# Patient Record
Sex: Male | Born: 1995 | Race: Black or African American | Hispanic: No | Marital: Single | Smoking: Former smoker
Health system: Southern US, Community
[De-identification: ages and names within clinical notes are randomized; demographics above are authoritative.]

## PROBLEM LIST (undated history)

## (undated) DIAGNOSIS — S62339A Displaced fracture of neck of unspecified metacarpal bone, initial encounter for closed fracture: Secondary | ICD-10-CM

---

## 2016-12-30 ENCOUNTER — Emergency Department (HOSPITAL_BASED_OUTPATIENT_CLINIC_OR_DEPARTMENT_OTHER): Payer: No Typology Code available for payment source

## 2016-12-30 ENCOUNTER — Encounter (HOSPITAL_BASED_OUTPATIENT_CLINIC_OR_DEPARTMENT_OTHER): Payer: Self-pay | Admitting: *Deleted

## 2016-12-30 ENCOUNTER — Emergency Department (HOSPITAL_BASED_OUTPATIENT_CLINIC_OR_DEPARTMENT_OTHER)
Admission: EM | Admit: 2016-12-30 | Discharge: 2016-12-31 | Disposition: A | Discharge status: Home / Self Care | Payer: No Typology Code available for payment source | Attending: Emergency Medicine | Admitting: Emergency Medicine

## 2016-12-30 DIAGNOSIS — M545 Low back pain: Secondary | ICD-10-CM | POA: Insufficient documentation

## 2016-12-30 DIAGNOSIS — Y999 Unspecified external cause status: Secondary | ICD-10-CM | POA: Diagnosis not present

## 2016-12-30 DIAGNOSIS — R51 Headache: Secondary | ICD-10-CM | POA: Insufficient documentation

## 2016-12-30 DIAGNOSIS — M25511 Pain in right shoulder: Secondary | ICD-10-CM | POA: Diagnosis not present

## 2016-12-30 DIAGNOSIS — M542 Cervicalgia: Secondary | ICD-10-CM | POA: Insufficient documentation

## 2016-12-30 DIAGNOSIS — Y939 Activity, unspecified: Secondary | ICD-10-CM | POA: Diagnosis not present

## 2016-12-30 DIAGNOSIS — Y92410 Unspecified street and highway as the place of occurrence of the external cause: Secondary | ICD-10-CM | POA: Diagnosis not present

## 2016-12-30 DIAGNOSIS — S199XXA Unspecified injury of neck, initial encounter: Secondary | ICD-10-CM | POA: Diagnosis present

## 2016-12-30 MED ORDER — NAPROXEN 250 MG PO TABS
500.0000 mg | ORAL_TABLET | Freq: Once | ORAL | Status: AC
  Administered 2016-12-30: 500 mg via ORAL
  Filled 2016-12-30: qty 2

## 2016-12-30 MED ORDER — CYCLOBENZAPRINE HCL 5 MG PO TABS
5.0000 mg | ORAL_TABLET | Freq: Once | ORAL | Status: AC
  Administered 2016-12-30: 5 mg via ORAL
  Filled 2016-12-30: qty 1
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
## 2016-12-30 NOTE — ED Notes (Signed)
Patient transported to X-ray 
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
## 2016-12-31 MED ORDER — CYCLOBENZAPRINE HCL 5 MG PO TABS: 5.0000 mg | ORAL_TABLET | Freq: Three times a day (TID) | ORAL | 0 refills | Status: DC | PRN

## 2016-12-31 MED ORDER — NAPROXEN 500 MG PO TABS: 500.0000 mg | ORAL_TABLET | Freq: Two times a day (BID) | ORAL | 0 refills | Status: DC

## 2016-12-31 NOTE — Discharge Instructions (Signed)
Read the information below.  Your x-rays and imaging were re-assuring.  You may feel sore for the next 2-3 days. I have prescribed naprosyn and flexeril for relief. While taking naprosyn do not take other NSAIDs (ibuprofen, motrin, or aleve). Flexeril can make you drowsy, do not drive after taking.  You can apply heat/ice to affected areas for 20 minute increments.  Warm showers can soothe sore muscles.  If symptoms persist for more than a week follow up with your primary provider.  Use the prescribed medication as directed.  Please discuss all new medications with your pharmacist.   You may return to the Emergency Department at any time for worsening condition or any new symptoms that concern you.

## 2020-02-23 ENCOUNTER — Other Ambulatory Visit: Payer: Self-pay

## 2020-02-23 ENCOUNTER — Emergency Department (HOSPITAL_BASED_OUTPATIENT_CLINIC_OR_DEPARTMENT_OTHER): Payer: Self-pay

## 2020-02-23 ENCOUNTER — Encounter (HOSPITAL_BASED_OUTPATIENT_CLINIC_OR_DEPARTMENT_OTHER): Payer: Self-pay

## 2020-02-23 ENCOUNTER — Emergency Department (HOSPITAL_BASED_OUTPATIENT_CLINIC_OR_DEPARTMENT_OTHER)
Admission: EM | Admit: 2020-02-23 | Discharge: 2020-02-24 | Disposition: A | Discharge status: Home / Self Care | Payer: Self-pay | Attending: Emergency Medicine | Admitting: Emergency Medicine

## 2020-02-23 DIAGNOSIS — S62326A Displaced fracture of shaft of fifth metacarpal bone, right hand, initial encounter for closed fracture: Secondary | ICD-10-CM | POA: Insufficient documentation

## 2020-02-23 DIAGNOSIS — W228XXA Striking against or struck by other objects, initial encounter: Secondary | ICD-10-CM | POA: Insufficient documentation

## 2020-02-23 DIAGNOSIS — Y9389 Activity, other specified: Secondary | ICD-10-CM | POA: Insufficient documentation

## 2020-02-23 DIAGNOSIS — Z880 Allergy status to penicillin: Secondary | ICD-10-CM | POA: Insufficient documentation

## 2020-02-23 DIAGNOSIS — S62339A Displaced fracture of neck of unspecified metacarpal bone, initial encounter for closed fracture: Secondary | ICD-10-CM

## 2020-02-23 DIAGNOSIS — Y929 Unspecified place or not applicable: Secondary | ICD-10-CM | POA: Insufficient documentation

## 2020-02-23 DIAGNOSIS — Y999 Unspecified external cause status: Secondary | ICD-10-CM | POA: Insufficient documentation

## 2020-02-23 NOTE — ED Triage Notes (Signed)
Pt states he punched a wall 2 days ago-swelling noted to right hand-NAD-steady gait

## 2020-02-24 ENCOUNTER — Encounter (HOSPITAL_BASED_OUTPATIENT_CLINIC_OR_DEPARTMENT_OTHER): Payer: Self-pay | Admitting: Orthopedic Surgery

## 2020-02-24 ENCOUNTER — Other Ambulatory Visit: Payer: Self-pay | Admitting: Orthopedic Surgery

## 2020-02-24 ENCOUNTER — Emergency Department (HOSPITAL_BASED_OUTPATIENT_CLINIC_OR_DEPARTMENT_OTHER): Payer: Self-pay

## 2020-02-24 ENCOUNTER — Inpatient Hospital Stay (HOSPITAL_COMMUNITY): Admission: RE | Admit: 2020-02-24 | Payer: Self-pay | Source: Ambulatory Visit

## 2020-02-24 MED ORDER — ACETAMINOPHEN 500 MG PO TABS
1000.0000 mg | ORAL_TABLET | Freq: Once | ORAL | Status: AC
  Administered 2020-02-24: 02:00:00 1000 mg via ORAL
  Filled 2020-02-24: qty 2

## 2020-02-24 MED ORDER — HYDROCODONE-ACETAMINOPHEN 5-325 MG PO TABS
1.0000 | ORAL_TABLET | Freq: Once | ORAL | Status: AC
  Administered 2020-02-24: 01:00:00 1 via ORAL
  Filled 2020-02-24: qty 1

## 2020-02-24 MED ORDER — LIDOCAINE HCL 2 % IJ SOLN
10.0000 mL | Freq: Once | INTRAMUSCULAR | Status: AC
  Administered 2020-02-24: 200 mg via INTRADERMAL
  Filled 2020-02-24: qty 20

## 2020-02-24 MED ORDER — HYDROCODONE-ACETAMINOPHEN 5-325 MG PO TABS: 1.0000 | ORAL_TABLET | Freq: Three times a day (TID) | ORAL | 0 refills | Status: AC | PRN

## 2020-02-24 MED ORDER — ACETAMINOPHEN 500 MG PO TABS: 1000.0000 mg | ORAL_TABLET | Freq: Three times a day (TID) | ORAL | 0 refills | Status: AC

## 2020-02-24 NOTE — ED Provider Notes (Signed)
[facility] EMERGENCY DEPARTMENT Provider Note  CSN: [id number] Arrival date & time: 02/23/20 2130  Chief Complaint(s) Hand Injury  HPI Cameron Franco is a 24 y.o. male with no pertinent past medical history presents to the emergency department with right hand pain and swelling that began 2 days ago after punching a wall.  Pain is throbbing.  Worse with palpation.  Patient is unable to range his small finger due to the pain.  Denies any numbness or tingling.  Denies any other injuries.  No other physical complaints.  HPI  Past Medical History History reviewed. No pertinent past medical history. There are no problems to display for this patient.  Home Medication(s) Prior to Admission medications   Medication Sig Start Date End Date Taking? Authorizing Provider  acetaminophen (TYLENOL) 500 MG tablet Take 2 tablets (1,000 mg total) by mouth every 8 (eight) hours for 5 days. Do not take more than 4000 mg of acetaminophen (Tylenol) in a 24-hour period. Please note that other medicines that you may be prescribed may have Tylenol as well. 02/24/20 02/29/20  Fatima Blank, MD  cyclobenzaprine (FLEXERIL) 5 MG tablet Take 1 tablet (5 mg total) by mouth 3 (three) times daily as needed for muscle spasms. 12/31/16   Frederica Kuster, PA-C  HYDROcodone-acetaminophen (NORCO/VICODIN) 5-325 MG tablet Take 1 tablet by mouth every 8 (eight) hours as needed for up to 3 days for severe pain (That is not improved by your scheduled acetaminophen regimen). Please do not exceed 4000 mg of acetaminophen (Tylenol) a 24-hour period. Please note that he may be prescribed additional medicine that contains acetaminophen. 02/24/20 02/27/20  Fatima Blank, MD  naproxen (NAPROSYN) 500 MG tablet Take 1 tablet (500 mg total) by mouth 2 (two) times daily. 12/31/16   Caryl Ada        Past Surgical History History reviewed. No pertinent surgical history. Family History No family history on file.  Social History Social History   Tobacco Use  . Smoking status: Former Research scientist (life sciences)  . Smokeless tobacco: Never Used  Substance Use Topics  . Alcohol use: Yes    Comment: occ  . Drug use: No   Allergies Penicillins  Review of Systems Review of Systems All other systems are reviewed and are negative for acute change except as noted in the HPI  Physical Exam Vital Signs  I have reviewed the triage vital signs BP 129/83   Pulse 73   Temp 98.7 F (37.1 C) (Oral)   Resp 18   Ht 5\' 10"  (1.778 m)   Wt 70.3 kg   SpO2 100%   BMI 22.24 kg/m   Physical Exam Vitals reviewed.  Constitutional:      General: He is not in acute distress.    Appearance: He is well-developed. He is not diaphoretic.  HENT:     Head: Normocephalic and atraumatic.     Jaw: No trismus.     Right Ear: External ear normal.     Left Ear: External ear normal.     Nose: Nose normal.  Eyes:     General: No scleral icterus.    Conjunctiva/sclera: Conjunctivae normal.  Neck:     Trachea: Phonation normal.  Cardiovascular:     Rate and Rhythm: Normal rate and regular rhythm.  Pulmonary:     Effort: Pulmonary effort is normal. No respiratory distress.     Breath sounds: No stridor.  Abdominal:     General: There is no distension.  Musculoskeletal:  Right hand: Swelling, deformity, tenderness and bony tenderness present. No lacerations. Decreased range of motion. Normal sensation. Normal capillary refill. Normal pulse.     Cervical back: Normal range of motion.  Neurological:     Mental Status: He is alert and oriented to person, place, and time.  Psychiatric:        Behavior: Behavior normal.     ED Results and Treatments Labs (all labs ordered are listed, but only abnormal results are displayed) Labs Reviewed - No data to display                                                                                                                        EKG  EKG Interpretation  Date/Time:    Ventricular Rate:    PR Interval:    QRS Duration:   QT Interval:    QTC Calculation:   R Axis:     Text Interpretation:        Radiology DG Hand 2 View Right  Result Date: 02/24/2020 CLINICAL DATA:  Punched a wall 3 days ago, reduction of fifth metacarpal fracture EXAM: RIGHT HAND - 2 VIEW COMPARISON:  02/23/2020 FINDINGS: Frontal and lateral views of the right hand demonstrate cast material along the ulnar aspect of the hand. The comminuted fifth metacarpal fracture demonstrates persistent but decreased volar angulation, measuring approximately 33 degrees. Otherwise alignment is anatomic. No additional fractures. IMPRESSION: 1. Persistent but decreased volar angulation of the comminuted fifth metacarpal fracture. Electronically Signed   By: Sharlet Salina M.D.   On: 02/24/2020 01:37   DG Hand Complete Right  Result Date: 02/23/2020 CLINICAL DATA:  Punched a wall 2 days ago.  Pain and swelling. EXAM: RIGHT HAND - COMPLETE 3+ VIEW COMPARISON:  01/08/2016 FINDINGS: Comminuted fracture of the mid-diaphysis the fifth metacarpal with slight dorsal angulation. Fracture does not extend to the proximal or distal articular surface. Other bones are normal. IMPRESSION: Comminuted fracture of the midportion of the fifth metacarpal with slight dorsal angulation. Electronically Signed   By: Paulina Fusi M.D.   On: 02/23/2020 22:05    Pertinent labs & imaging results that were available during my care of the patient were reviewed by me and considered in my medical decision making (see chart for details).  Medications Ordered in ED Medications  lidocaine (XYLOCAINE) 2 % (with pres) injection 200 mg (200 mg Intradermal Given 02/24/20 0112)  HYDROcodone-acetaminophen (NORCO/VICODIN) 5-325 MG per tablet 1 tablet (1 tablet Oral Given 02/24/20 0036)  acetaminophen (TYLENOL) tablet 1,000 mg (1,000 mg  Oral Given 02/24/20 0137)  Procedures .Ortho Injury Treatment  Date/Time: 02/24/2020 2:59 AM Performed by: Nira Conn, MD Authorized by: Nira Conn, MD   Consent:    Consent obtained:  Verbal   Consent given by:  Patient   Risks discussed:  Irreducible dislocation, recurrent dislocation and restricted joint movement   Alternatives discussed:  Immobilization and referralInjury location: hand Location details: right hand Injury type: fracture-dislocation Pre-procedure neurovascular assessment: neurovascularly intact Pre-procedure range of motion: reduced Anesthesia: hematoma block  Anesthesia: Local anesthesia used: yes Local Anesthetic: lidocaine 2% without epinephrine Anesthetic total: 10 mL  Patient sedated: NoManipulation performed: yes Skeletal traction used: yes Reduction successful: yes X-ray confirmed reduction: yes Immobilization: splint Splint type: ulnar gutter Supplies used: Ortho-Glass Post-procedure neurovascular assessment: post-procedure neurovascularly intact Patient tolerance: patient tolerated the procedure well with no immediate complications  .Splint Application  Date/Time: 02/24/2020 3:00 AM Performed by: Nira Conn, MD Authorized by: Nira Conn, MD   Consent:    Consent obtained:  Verbal   Consent given by:  Patient   Risks discussed:  Numbness Pre-procedure details:    Sensation:  Normal Procedure details:    Laterality:  Right   Location:  Hand   Hand:  R hand   Splint type:  Ulnar gutter   Supplies:  Ortho-Glass Post-procedure details:    Sensation:  Normal   Patient tolerance of procedure:  Tolerated well, no immediate complications    (including critical care time)  Medical Decision Making / ED Course I have reviewed the nursing notes for this encounter and  the patient's prior records (if available in EHR or on provided paperwork).   Barnett Elzey was evaluated in Emergency Department on 02/24/2020 for the symptoms described in the history of present illness. He was evaluated in the context of the global COVID-19 pandemic, which necessitated consideration that the patient might be at risk for infection with the SARS-CoV-2 virus that causes COVID-19. Institutional protocols and algorithms that pertain to the evaluation of patients at risk for COVID-19 are in a state of rapid change based on information released by regulatory bodies including the CDC and federal and state organizations. These policies and algorithms were followed during the patient's care in the ED.  Plain film consistent with boxer's fracture with dorsal angulation. Fracture was reduced under hematoma block and ulnar splints placed. Improved angulation following reduction. We will have patient follow-up with hand surgery.      Final Clinical Impression(s) / ED Diagnoses Final diagnoses:  Closed boxer's fracture, initial encounter   The patient appears reasonably screened and/or stabilized for discharge and I doubt any other medical condition or other [facility] requiring further screening, evaluation, or treatment in the ED at this time prior to discharge. Safe for discharge with strict return precautions.  Disposition: Discharge  Condition: Good  I have discussed the results, Dx and Tx plan with the patient/family who expressed understanding and agree(s) with the plan. Discharge instructions discussed at length. The patient/family was given strict return precautions who verbalized understanding of the instructions. No further questions at time of discharge.    ED Discharge Orders         Ordered    acetaminophen (TYLENOL) 500 MG tablet  Every 8 hours     02/24/20 0129    HYDROcodone-acetaminophen (NORCO/VICODIN) 5-325 MG tablet  Every 8 hours PRN     02/24/20 0129           [facility] narcotic database reviewed and no active prescriptions noted.  Follow Up: Mack Hook, MD [address] [place] [address] [phone]  Schedule an appointment as soon as possible for a visit        This chart was dictated using voice recognition software.  Despite best efforts to proofread,  errors can occur which can change the documentation meaning.   Nira Conn, MD 02/24/20 [phone]

## 2020-02-24 NOTE — H&P (View-Only) (Signed)
Cameron Franco is an 24 y.o. male.   CC / Reason for Visit: Right hand problem HPI: This patient is a 24-year-old, right-hand-dominant, male who punched a wall on 02/21/2020 injuring his right hand.  He was seen at the emergency department where x-rays were taken, he was placed in an ulnar gutter splint and referred here for further evaluation and treatment.  Patient indicates that he is not currently employed outside the home but when he is, he performs warehouse work.  He indicates that he has taken hydrocodone for pain.  Past Medical History:  Diagnosis Date  . Boxers fracture     History reviewed. No pertinent surgical history.  History reviewed. No pertinent family history. Social History:  reports that he has quit smoking. He has never used smokeless tobacco. He reports current alcohol use. He reports current drug use. Drug: Marijuana.  Allergies:  Allergies  Allergen Reactions  . Penicillins     No medications prior to admission.    No results found for this or any previous visit (from the past 48 hour(s)). DG Hand 2 View Right  Result Date: 02/24/2020 CLINICAL DATA:  Punched a wall 3 days ago, reduction of fifth metacarpal fracture EXAM: RIGHT HAND - 2 VIEW COMPARISON:  02/23/2020 FINDINGS: Frontal and lateral views of the right hand demonstrate cast material along the ulnar aspect of the hand. The comminuted fifth metacarpal fracture demonstrates persistent but decreased volar angulation, measuring approximately 33 degrees. Otherwise alignment is anatomic. No additional fractures. IMPRESSION: 1. Persistent but decreased volar angulation of the comminuted fifth metacarpal fracture. Electronically Signed   By: Michael  Brown M.D.   On: 02/24/2020 01:37   DG Hand Complete Right  Result Date: 02/23/2020 CLINICAL DATA:  Punched a wall 2 days ago.  Pain and swelling. EXAM: RIGHT HAND - COMPLETE 3+ VIEW COMPARISON:  01/08/2016 FINDINGS: Comminuted fracture of the mid-diaphysis the  fifth metacarpal with slight dorsal angulation. Fracture does not extend to the proximal or distal articular surface. Other bones are normal. IMPRESSION: Comminuted fracture of the midportion of the fifth metacarpal with slight dorsal angulation. Electronically Signed   By: Mark  Shogry M.D.   On: 02/23/2020 22:05    Review of Systems  All other systems reviewed and are negative.   Height 5' 10" (1.778 m), weight 72.6 kg. Physical Exam  Constitutional:  WD, WN, NAD HEENT:  NCAT, EOMI Neuro/Psych:  Alert & oriented to person, place, and time; appropriate mood & affect Lymphatic: No generalized UE edema or lymphadenopathy Extremities / MSK:  Both UE are normal with respect to appearance, ranges of motion, joint stability, muscle strength/tone, sensation, & perfusion except as otherwise noted:  The splint on the right hand is removed.  The patient is into a full fist and there is some malrotation of the small finger with a digit angulating radially.  Additionally there is significant swelling on the dorsum of the hand and ecchymosis in the palmar aspect of the hand.  Light touch sensibility is intact.  Labs / Xrays:  No radiographic studies obtained today.  X-rays including 2 views of the the right hand ordered and obtained elsewhere and viewed on canopy demonstrated a closed extra-articular volarly angulated fifth metacarpal shaft fracture.  Assessment: Right fifth metacarpal fracture  Plan:  Findings are discussed with the patient.  He will be scheduled for surgery, ORIF of right metacarpal fracture.The details of the operative procedure were discussed with the patient.  Questions were invited and answered.  In addition to   the goal of the procedure, the risks of the procedure to include but not limited to bleeding; infection; damage to the nerves or blood vessels that could result in bleeding, numbness, weakness, chronic pain, and the need for additional procedures; stiffness; the need for  revision surgery; and anesthetic risks were reviewed.  No specific outcome was guaranteed or implied.  Informed consent was obtained.   Jodi Marble, MD 02/24/2020, 8:01 PM

## 2020-02-24 NOTE — H&P (Signed)
Cameron Franco is an 24 y.o. male.   CC / Reason for Visit: Right hand problem HPI: This patient is a 24 year old, right-hand-dominant, male who punched a wall on 02/21/2020 injuring his right hand.  He was seen at the emergency department where x-rays were taken, he was placed in an ulnar gutter splint and referred here for further evaluation and treatment.  Patient indicates that he is not currently employed outside the home but when he is, he performs warehouse work.  He indicates that he has taken hydrocodone for pain.  Past Medical History:  Diagnosis Date  . Boxers fracture     History reviewed. No pertinent surgical history.  History reviewed. No pertinent family history. Social History:  reports that he has quit smoking. He has never used smokeless tobacco. He reports current alcohol use. He reports current drug use. Drug: Marijuana.  Allergies:  Allergies  Allergen Reactions  . Penicillins     No medications prior to admission.    No results found for this or any previous visit (from the past 48 hour(s)). DG Hand 2 View Right  Result Date: 02/24/2020 CLINICAL DATA:  Punched a wall 3 days ago, reduction of fifth metacarpal fracture EXAM: RIGHT HAND - 2 VIEW COMPARISON:  02/23/2020 FINDINGS: Frontal and lateral views of the right hand demonstrate cast material along the ulnar aspect of the hand. The comminuted fifth metacarpal fracture demonstrates persistent but decreased volar angulation, measuring approximately 33 degrees. Otherwise alignment is anatomic. No additional fractures. IMPRESSION: 1. Persistent but decreased volar angulation of the comminuted fifth metacarpal fracture. Electronically Signed   By: Randa Ngo M.D.   On: 02/24/2020 01:37   DG Hand Complete Right  Result Date: 02/23/2020 CLINICAL DATA:  Punched a wall 2 days ago.  Pain and swelling. EXAM: RIGHT HAND - COMPLETE 3+ VIEW COMPARISON:  01/08/2016 FINDINGS: Comminuted fracture of the mid-diaphysis the  fifth metacarpal with slight dorsal angulation. Fracture does not extend to the proximal or distal articular surface. Other bones are normal. IMPRESSION: Comminuted fracture of the midportion of the fifth metacarpal with slight dorsal angulation. Electronically Signed   By: Nelson Chimes M.D.   On: 02/23/2020 22:05    Review of Systems  All other systems reviewed and are negative.   Height 5\' 10"  (1.778 m), weight 72.6 kg. Physical Exam  Constitutional:  WD, WN, NAD HEENT:  NCAT, EOMI Neuro/Psych:  Alert & oriented to person, place, and time; appropriate mood & affect Lymphatic: No generalized UE edema or lymphadenopathy Extremities / MSK:  Both UE are normal with respect to appearance, ranges of motion, joint stability, muscle strength/tone, sensation, & perfusion except as otherwise noted:  The splint on the right hand is removed.  The patient is into a full fist and there is some malrotation of the small finger with a digit angulating radially.  Additionally there is significant swelling on the dorsum of the hand and ecchymosis in the palmar aspect of the hand.  Light touch sensibility is intact.  Labs / Xrays:  No radiographic studies obtained today.  X-rays including 2 views of the the right hand ordered and obtained elsewhere and viewed on canopy demonstrated a closed extra-articular volarly angulated fifth metacarpal shaft fracture.  Assessment: Right fifth metacarpal fracture  Plan:  Findings are discussed with the patient.  He will be scheduled for surgery, ORIF of right metacarpal fracture.The details of the operative procedure were discussed with the patient.  Questions were invited and answered.  In addition to  the goal of the procedure, the risks of the procedure to include but not limited to bleeding; infection; damage to the nerves or blood vessels that could result in bleeding, numbness, weakness, chronic pain, and the need for additional procedures; stiffness; the need for  revision surgery; and anesthetic risks were reviewed.  No specific outcome was guaranteed or implied.  Informed consent was obtained.   Jodi Marble, MD 02/24/2020, 8:01 PM

## 2020-02-25 ENCOUNTER — Other Ambulatory Visit (HOSPITAL_COMMUNITY): Admission: RE | Admit: 2020-02-25 | Payer: Medicaid Other | Source: Ambulatory Visit

## 2020-02-28 ENCOUNTER — Ambulatory Visit (HOSPITAL_BASED_OUTPATIENT_CLINIC_OR_DEPARTMENT_OTHER): Admission: RE | Admit: 2020-02-28 | Payer: Medicaid Other | Source: Home / Self Care | Admitting: Orthopedic Surgery

## 2020-02-28 ENCOUNTER — Encounter (HOSPITAL_COMMUNITY): Payer: Self-pay | Admitting: Anesthesiology

## 2020-02-28 HISTORY — DX: Displaced fracture of neck of unspecified metacarpal bone, initial encounter for closed fracture: S62.339A

## 2020-02-28 SURGERY — OPEN REDUCTION INTERNAL FIXATION (ORIF) METACARPAL
Anesthesia: Monitor Anesthesia Care | Site: Finger | Laterality: Right

## 2020-02-28 NOTE — Discharge Instructions (Signed)
Discharge Instructions   You have a dressing with a plaster splint incorporated in it. Move your fingers as much as possible, making a full fist and fully opening the fist. Elevate your hand to reduce pain & swelling of the digits.  Ice over the operative site may be helpful to reduce pain & swelling.  DO NOT USE HEAT. Pain medicine has been prescribed for you.  Take Tylenol 650 mg and Ibuprofen 600 mg every 6 hours together for pain. Pain medicine that is prescribed can be taken additionally as a rescue medicine only. Leave the dressing in place until you return to our office.  You may shower, but keep the bandage clean & dry.  You may drive a car when you are off of prescription pain medications and can safely control your vehicle with both hands. Please call our office to schedule a follow up appointment for 10-15 days from the date of surgery   Please call [phone] during normal business hours or [phone] after hours for any problems. Including the following:  - excessive redness of the incisions - drainage for more than 4 days - fever of more than 101.5 F  *Please note that pain medications will not be refilled after hours or on weekends.

## 2020-02-29 ENCOUNTER — Encounter (HOSPITAL_BASED_OUTPATIENT_CLINIC_OR_DEPARTMENT_OTHER): Payer: Self-pay | Admitting: Orthopedic Surgery

## 2020-02-29 ENCOUNTER — Other Ambulatory Visit: Payer: Self-pay | Admitting: Orthopedic Surgery

## 2020-02-29 ENCOUNTER — Other Ambulatory Visit (HOSPITAL_COMMUNITY)
Admission: RE | Admit: 2020-02-29 | Discharge: 2020-02-29 | Disposition: A | Discharge status: Home / Self Care | Payer: HRSA Program | Source: Ambulatory Visit | Attending: Orthopedic Surgery | Admitting: Orthopedic Surgery

## 2020-02-29 DIAGNOSIS — Z01812 Encounter for preprocedural laboratory examination: Secondary | ICD-10-CM | POA: Insufficient documentation

## 2020-02-29 DIAGNOSIS — Z20822 Contact with and (suspected) exposure to covid-19: Secondary | ICD-10-CM | POA: Diagnosis not present

## 2020-02-29 LAB — SARS CORONAVIRUS 2 (TAT 6-24 HRS): SARS Coronavirus 2: NEGATIVE

## 2020-03-02 ENCOUNTER — Encounter (HOSPITAL_BASED_OUTPATIENT_CLINIC_OR_DEPARTMENT_OTHER): Payer: Self-pay | Admitting: Orthopedic Surgery

## 2020-03-02 NOTE — Anesthesia Preprocedure Evaluation (Addendum)
Anesthesia Evaluation  Patient identified by MRN, date of birth, ID band Patient awake    Reviewed: Allergy & Precautions, NPO status , Patient's Chart, lab work & pertinent test results  Airway Mallampati: II  TM Distance: >3 FB Neck ROM: Full    Dental no notable dental hx. (+) Teeth Intact   Pulmonary Patient abstained from smoking., former smoker,    Pulmonary exam normal breath sounds clear to auscultation       Cardiovascular negative cardio ROS Normal cardiovascular exam Rhythm:Regular Rate:Normal     Neuro/Psych negative neurological ROS  negative psych ROS   GI/Hepatic negative GI ROS, (+)     substance abuse  alcohol use and marijuana use,   Endo/Other  negative endocrine ROS  Renal/GU negative Renal ROS  negative genitourinary   Musculoskeletal Fx Right 5th Metacarpal   Abdominal   Peds  Hematology negative hematology ROS (+)   Anesthesia Other Findings   Reproductive/Obstetrics                            Anesthesia Physical Anesthesia Plan  ASA: II  Anesthesia Plan: Regional and MAC   Post-op Pain Management:    Induction:   PONV Risk Score and Plan: 1 and Ondansetron, Treatment may vary due to age or medical condition and Propofol infusion  Airway Management Planned: Natural Airway, Nasal Cannula and Simple Face Mask  Additional Equipment:   Intra-op Plan:   Post-operative Plan:   Informed Consent: I have reviewed the patients History and Physical, chart, labs and discussed the procedure including the risks, benefits and alternatives for the proposed anesthesia with the patient or authorized representative who has indicated his/her understanding and acceptance.     Dental advisory given  Plan Discussed with: CRNA and Anesthesiologist  Anesthesia Plan Comments:        Anesthesia Quick Evaluation

## 2020-03-03 ENCOUNTER — Ambulatory Visit (HOSPITAL_BASED_OUTPATIENT_CLINIC_OR_DEPARTMENT_OTHER): Payer: Medicaid Other | Admitting: Anesthesiology

## 2020-03-03 ENCOUNTER — Encounter (HOSPITAL_BASED_OUTPATIENT_CLINIC_OR_DEPARTMENT_OTHER): Payer: Self-pay | Admitting: Orthopedic Surgery

## 2020-03-03 ENCOUNTER — Encounter (HOSPITAL_BASED_OUTPATIENT_CLINIC_OR_DEPARTMENT_OTHER)
Admission: RE | Disposition: A | Discharge status: Home / Self Care | Payer: Self-pay | Source: Home / Self Care | Attending: Orthopedic Surgery

## 2020-03-03 ENCOUNTER — Other Ambulatory Visit: Payer: Self-pay

## 2020-03-03 ENCOUNTER — Ambulatory Visit (HOSPITAL_BASED_OUTPATIENT_CLINIC_OR_DEPARTMENT_OTHER)
Admission: RE | Admit: 2020-03-03 | Discharge: 2020-03-03 | Disposition: A | Discharge status: Home / Self Care | Payer: Medicaid Other | Attending: Orthopedic Surgery | Admitting: Orthopedic Surgery

## 2020-03-03 ENCOUNTER — Ambulatory Visit (HOSPITAL_COMMUNITY): Payer: Medicaid Other

## 2020-03-03 DIAGNOSIS — W228XXA Striking against or struck by other objects, initial encounter: Secondary | ICD-10-CM | POA: Insufficient documentation

## 2020-03-03 DIAGNOSIS — Z419 Encounter for procedure for purposes other than remedying health state, unspecified: Secondary | ICD-10-CM

## 2020-03-03 DIAGNOSIS — Z87891 Personal history of nicotine dependence: Secondary | ICD-10-CM | POA: Insufficient documentation

## 2020-03-03 DIAGNOSIS — S62306A Unspecified fracture of fifth metacarpal bone, right hand, initial encounter for closed fracture: Secondary | ICD-10-CM | POA: Insufficient documentation

## 2020-03-03 HISTORY — PX: OPEN REDUCTION INTERNAL FIXATION (ORIF) METACARPAL: SHX6234

## 2020-03-03 SURGERY — OPEN REDUCTION INTERNAL FIXATION (ORIF) METACARPAL
Anesthesia: Monitor Anesthesia Care | Site: Finger | Laterality: Right

## 2020-03-03 MED ORDER — LACTATED RINGERS IV SOLN: INTRAVENOUS | Status: DC

## 2020-03-03 MED ORDER — ACETAMINOPHEN 325 MG PO TABS: 650.0000 mg | ORAL_TABLET | Freq: Four times a day (QID) | ORAL | Status: DC

## 2020-03-03 MED ORDER — CLINDAMYCIN PHOSPHATE 900 MG/50ML IV SOLN
900.0000 mg | INTRAVENOUS | Status: AC
  Administered 2020-03-03: 08:00:00 900 mg via INTRAVENOUS

## 2020-03-03 MED ORDER — FENTANYL CITRATE (PF) 100 MCG/2ML IJ SOLN
INTRAMUSCULAR | Status: AC
  Filled 2020-03-03: qty 2

## 2020-03-03 MED ORDER — DEXAMETHASONE SODIUM PHOSPHATE 10 MG/ML IJ SOLN
INTRAMUSCULAR | Status: DC | PRN
  Administered 2020-03-03: 5 mg via INTRAVENOUS

## 2020-03-03 MED ORDER — FENTANYL CITRATE (PF) 100 MCG/2ML IJ SOLN
50.0000 ug | INTRAMUSCULAR | Status: DC | PRN
  Administered 2020-03-03: 100 ug via INTRAVENOUS

## 2020-03-03 MED ORDER — MIDAZOLAM HCL 2 MG/2ML IJ SOLN
INTRAMUSCULAR | Status: AC
  Filled 2020-03-03: qty 2

## 2020-03-03 MED ORDER — GLYCOPYRROLATE 0.2 MG/ML IJ SOLN
INTRAMUSCULAR | Status: DC | PRN
  Administered 2020-03-03: .2 mg via INTRAVENOUS

## 2020-03-03 MED ORDER — EPHEDRINE SULFATE 50 MG/ML IJ SOLN
INTRAMUSCULAR | Status: DC | PRN
  Administered 2020-03-03 (×2): 5 mg via INTRAVENOUS

## 2020-03-03 MED ORDER — CHLORHEXIDINE GLUCONATE 4 % EX LIQD: 60.0000 mL | Freq: Once | CUTANEOUS | Status: DC

## 2020-03-03 MED ORDER — ROPIVACAINE HCL 5 MG/ML IJ SOLN
INTRAMUSCULAR | Status: DC | PRN
  Administered 2020-03-03: 30 mL via PERINEURAL

## 2020-03-03 MED ORDER — FENTANYL CITRATE (PF) 100 MCG/2ML IJ SOLN: 25.0000 ug | INTRAMUSCULAR | Status: DC | PRN

## 2020-03-03 MED ORDER — GLYCOPYRROLATE PF 0.2 MG/ML IJ SOSY
PREFILLED_SYRINGE | INTRAMUSCULAR | Status: AC
  Filled 2020-03-03: qty 1

## 2020-03-03 MED ORDER — LIDOCAINE 2% (20 MG/ML) 5 ML SYRINGE
INTRAMUSCULAR | Status: AC
  Filled 2020-03-03: qty 5

## 2020-03-03 MED ORDER — DEXAMETHASONE SODIUM PHOSPHATE 10 MG/ML IJ SOLN
INTRAMUSCULAR | Status: AC
  Filled 2020-03-03: qty 3

## 2020-03-03 MED ORDER — CLINDAMYCIN PHOSPHATE 900 MG/50ML IV SOLN
INTRAVENOUS | Status: AC
  Filled 2020-03-03: qty 50

## 2020-03-03 MED ORDER — OXYCODONE HCL 5 MG PO TABS: 5.0000 mg | ORAL_TABLET | Freq: Once | ORAL | Status: DC | PRN

## 2020-03-03 MED ORDER — SODIUM CHLORIDE 0.9 % IR SOLN
Status: DC | PRN
  Administered 2020-03-03: 200 mL

## 2020-03-03 MED ORDER — IBUPROFEN 200 MG PO TABS: 600.0000 mg | ORAL_TABLET | Freq: Four times a day (QID) | ORAL | 0 refills | Status: AC

## 2020-03-03 MED ORDER — MIDAZOLAM HCL 2 MG/2ML IJ SOLN
1.0000 mg | INTRAMUSCULAR | Status: DC | PRN
  Administered 2020-03-03: 2 mg via INTRAVENOUS

## 2020-03-03 MED ORDER — ACETAMINOPHEN 325 MG PO TABS: 650.0000 mg | ORAL_TABLET | Freq: Four times a day (QID) | ORAL | Status: AC

## 2020-03-03 MED ORDER — FENTANYL CITRATE (PF) 100 MCG/2ML IJ SOLN
INTRAMUSCULAR | Status: DC | PRN
  Administered 2020-03-03 (×2): 25 ug via INTRAVENOUS

## 2020-03-03 MED ORDER — ONDANSETRON HCL 4 MG/2ML IJ SOLN
INTRAMUSCULAR | Status: DC | PRN
  Administered 2020-03-03: 4 mg via INTRAVENOUS

## 2020-03-03 MED ORDER — CLINDAMYCIN PHOSPHATE 900 MG/50ML IV SOLN: 900.0000 mg | INTRAVENOUS | Status: DC

## 2020-03-03 MED ORDER — PROPOFOL 500 MG/50ML IV EMUL
INTRAVENOUS | Status: DC | PRN
  Administered 2020-03-03: 25 ug/kg/min via INTRAVENOUS

## 2020-03-03 MED ORDER — OXYCODONE HCL 5 MG PO TABS: 5.0000 mg | ORAL_TABLET | Freq: Four times a day (QID) | ORAL | 0 refills | Status: AC | PRN

## 2020-03-03 MED ORDER — ONDANSETRON HCL 4 MG/2ML IJ SOLN
INTRAMUSCULAR | Status: AC
  Filled 2020-03-03: qty 2

## 2020-03-03 MED ORDER — OXYCODONE HCL 5 MG/5ML PO SOLN: 5.0000 mg | Freq: Once | ORAL | Status: DC | PRN

## 2020-03-03 MED ORDER — IBUPROFEN 200 MG PO TABS: 600.0000 mg | ORAL_TABLET | Freq: Four times a day (QID) | ORAL | 0 refills | Status: DC

## 2020-03-03 MED ORDER — PROPOFOL 500 MG/50ML IV EMUL
INTRAVENOUS | Status: AC
  Filled 2020-03-03: qty 50

## 2020-03-03 MED ORDER — PROPOFOL 10 MG/ML IV BOLUS
INTRAVENOUS | Status: DC | PRN
  Administered 2020-03-03: 30 mg via INTRAVENOUS

## 2020-03-03 MED ORDER — MIDAZOLAM HCL 2 MG/2ML IJ SOLN
INTRAMUSCULAR | Status: DC | PRN
  Administered 2020-03-03 (×2): 1 mg via INTRAVENOUS

## 2020-03-03 MED ORDER — EPHEDRINE 5 MG/ML INJ
INTRAVENOUS | Status: AC
  Filled 2020-03-03: qty 10

## 2020-03-03 MED ORDER — LIDOCAINE HCL (CARDIAC) PF 100 MG/5ML IV SOSY
PREFILLED_SYRINGE | INTRAVENOUS | Status: DC | PRN
  Administered 2020-03-03: 50 mg via INTRATRACHEAL

## 2020-03-03 SURGICAL SUPPLY — 56 items
BIT DRILL 1.1 (BIT) ×1
BIT DRILL 1.1MM (BIT) ×1
BIT DRILL 60X20X1.1XQC TMX (BIT) ×1 IMPLANT
BIT DRL 60X20X1.1XQC TMX (BIT) ×1
BLADE SURG 15 STRL LF DISP TIS (BLADE) ×1 IMPLANT
BLADE SURG 15 STRL SS (BLADE) ×2
BNDG COHESIVE 1X5 TAN STRL LF (GAUZE/BANDAGES/DRESSINGS) IMPLANT
BNDG COHESIVE 2X5 TAN STRL LF (GAUZE/BANDAGES/DRESSINGS) IMPLANT
BNDG COHESIVE 4X5 TAN STRL (GAUZE/BANDAGES/DRESSINGS) IMPLANT
BNDG CONFORM 2 STRL LF (GAUZE/BANDAGES/DRESSINGS) IMPLANT
BNDG ESMARK 4X9 LF (GAUZE/BANDAGES/DRESSINGS) ×3 IMPLANT
BNDG GAUZE ELAST 4 BULKY (GAUZE/BANDAGES/DRESSINGS) ×3 IMPLANT
CHLORAPREP W/TINT 26 (MISCELLANEOUS) ×3 IMPLANT
CORD BIPOLAR FORCEPS 12FT (ELECTRODE) ×3 IMPLANT
COVER BACK TABLE 60X90IN (DRAPES) ×3 IMPLANT
COVER MAYO STAND STRL (DRAPES) ×3 IMPLANT
CUFF TOURN SGL QUICK 18X4 (TOURNIQUET CUFF) ×3 IMPLANT
DRAPE C-ARM 42X72 X-RAY (DRAPES) ×3 IMPLANT
DRAPE EXTREMITY T 121X128X90 (DISPOSABLE) ×3 IMPLANT
DRAPE SURG 17X23 STRL (DRAPES) ×3 IMPLANT
DRSG EMULSION OIL 3X3 NADH (GAUZE/BANDAGES/DRESSINGS) ×3 IMPLANT
GAUZE SPONGE 4X4 12PLY STRL LF (GAUZE/BANDAGES/DRESSINGS) ×3 IMPLANT
GLOVE BIO SURGEON STRL SZ7.5 (GLOVE) ×3 IMPLANT
GLOVE BIOGEL PI IND STRL 7.0 (GLOVE) ×2 IMPLANT
GLOVE BIOGEL PI IND STRL 8 (GLOVE) ×1 IMPLANT
GLOVE BIOGEL PI INDICATOR 7.0 (GLOVE) ×4
GLOVE BIOGEL PI INDICATOR 8 (GLOVE) ×2
GLOVE ECLIPSE 6.5 STRL STRAW (GLOVE) ×3 IMPLANT
GOWN STRL REUS W/ TWL LRG LVL3 (GOWN DISPOSABLE) ×2 IMPLANT
GOWN STRL REUS W/TWL LRG LVL3 (GOWN DISPOSABLE) ×4
GOWN STRL REUS W/TWL XL LVL3 (GOWN DISPOSABLE) ×3 IMPLANT
NEEDLE HYPO 25X1 1.5 SAFETY (NEEDLE) IMPLANT
NS IRRIG 1000ML POUR BTL (IV SOLUTION) ×3 IMPLANT
PACK BASIN DAY SURGERY FS (CUSTOM PROCEDURE TRAY) ×3 IMPLANT
PADDING CAST ABS 4INX4YD NS (CAST SUPPLIES) ×2
PADDING CAST ABS COTTON 4X4 ST (CAST SUPPLIES) ×1 IMPLANT
PADDING UNDERCAST 2 STRL (CAST SUPPLIES)
PADDING UNDERCAST 2X4 STRL (CAST SUPPLIES) IMPLANT
PLATE LOCKING 1.5 Y SHAPE (Plate) ×3 IMPLANT
SCREW L 1.5X12 (Screw) ×3 IMPLANT
SCREW LOCKING 1.5X10 (Screw) ×3 IMPLANT
SCREW LOCKING 1.5X11MM (Screw) ×6 IMPLANT
SCREW LOCKING 1.5X13MM (Screw) ×15 IMPLANT
SCREW NL 1.5X11 WRIST (Screw) ×3 IMPLANT
SLING ARM FOAM STRAP LRG (SOFTGOODS) ×3 IMPLANT
SPLINT UNIVERSAL RIGHT (SOFTGOODS) ×3 IMPLANT
STOCKINETTE 6  STRL (DRAPES) ×2
STOCKINETTE 6 STRL (DRAPES) ×1 IMPLANT
SUCTION FRAZIER HANDLE 10FR (MISCELLANEOUS) ×2
SUCTION TUBE FRAZIER 10FR DISP (MISCELLANEOUS) ×1 IMPLANT
SUT VICRYL RAPIDE 4-0 (SUTURE) IMPLANT
SUT VICRYL RAPIDE 4/0 PS 2 (SUTURE) ×3 IMPLANT
SYR 10ML LL (SYRINGE) IMPLANT
SYR BULB 3OZ (MISCELLANEOUS) ×3 IMPLANT
TOWEL GREEN STERILE FF (TOWEL DISPOSABLE) ×3 IMPLANT
UNDERPAD 30X36 HEAVY ABSORB (UNDERPADS AND DIAPERS) ×3 IMPLANT

## 2020-03-03 NOTE — Progress Notes (Signed)
Assisted Dr. Foster with right, ultrasound guided, supraclavicular block. Side rails up, monitors on throughout procedure. See vital signs in flow sheet. Tolerated Procedure well. °

## 2020-03-03 NOTE — Transfer of Care (Signed)
Immediate Anesthesia Transfer of Care Note  Patient: Cameron Franco  Procedure(s) Performed: PINNING VS OPEN TREATMENT OF RIGHT FIFTH METACARPAL FRACTURE (Right Finger)  Patient Location: PACU  Anesthesia Type:MAC combined with regional for post-op pain  Level of Consciousness: awake, alert , oriented and patient cooperative  Airway & Oxygen Therapy: Patient Spontanous Breathing  Post-op Assessment: Report given to RN and Post -op Vital signs reviewed and stable  Post vital signs: Reviewed and stable  Last Vitals:  Vitals Value Taken Time  BP 109/78 03/03/20 0835  Temp    Pulse 67 03/03/20 0836  Resp 18 03/03/20 0837  SpO2 100 % 03/03/20 0836  Vitals shown include unvalidated device data.  Last Pain:  Vitals:   03/03/20 0633  TempSrc: Oral  PainSc: 6       Patients Stated Pain Goal: 3 (88/[id number]/57 [id number])  Complications: No apparent anesthesia complications

## 2020-03-03 NOTE — Anesthesia Postprocedure Evaluation (Addendum)
Anesthesia Post Note  Patient: Engineer, maintenance (IT)  Procedure(s) Performed: OPEN TREATMENT OF RIGHT FIFTH METACARPAL FRACTURE (Right Finger)     Patient location during evaluation: PACU Anesthesia Type: Regional and MAC Level of consciousness: awake and alert and oriented Pain management: pain level controlled Vital Signs Assessment: post-procedure vital signs reviewed and stable Respiratory status: spontaneous breathing, nonlabored ventilation and respiratory function stable Cardiovascular status: stable and blood pressure returned to baseline Postop Assessment: no apparent nausea or vomiting Anesthetic complications: no    Last Vitals:  Vitals:   03/03/20 0919 03/03/20 0920  BP:  (!) 120/99  Pulse: (!) 58   Resp:  (!) 24  Temp: 36.6 C   SpO2:  100%    Last Pain:  Vitals:   03/03/20 0920  TempSrc:   PainSc: 0-No pain                 Raheem Kolbe A.

## 2020-03-03 NOTE — Op Note (Signed)
03/03/2020  7:27 AM  PATIENT:  Cameron Franco  24 y.o. male  PRE-OPERATIVE DIAGNOSIS:  Displaced angulated R 5 MC fx  POST-OPERATIVE DIAGNOSIS:  Same  PROCEDURE:  ORIF R 5 MC fx  SURGEON: Rayvon Char. Grandville Silos, MD  PHYSICIAN ASSISTANT: Morley Kos, OPA-C  ANESTHESIA:  regional and MAC  SPECIMENS:  None  DRAINS:   None  EBL:  less than 50 mL  PREOPERATIVE INDICATIONS:  Cameron Franco is a  24 y.o. male with a displaced angulated R 5 MC fx  The risks benefits and alternatives were discussed with the patient preoperatively including but not limited to the risks of infection, bleeding, nerve injury, cardiopulmonary complications, the need for revision surgery, among others, and the patient verbalized understanding and consented to proceed.  OPERATIVE IMPLANTS: Biomet ALPS 1.47m plate/screws  OPERATIVE PROCEDURE:  After receiving prophylactic antibiotics and a regional block and a pre-scrub, the patient was escorted to the operative theatre and placed in a supine position.  A surgical "time-out" was performed during which the planned procedure, proposed operative site, and the correct patient identity were compared to the operative consent and agreement confirmed by the circulating nurse according to current facility policy.  Following application of a tourniquet to the operative extremity, the exposed skin was prepped with Chloraprep and draped in the usual sterile fashion.  The limb was exsanguinated with an Esmarch bandage and the tourniquet inflated to approximately 1075mg higher than systolic BP.  A linear dorsal approach was made over the fifth metacarpal.  Subcutaneous tissues were dissected with blunt and spreading dissection elevating full-thickness flaps.  The 2 extensor tendons were widely separated until very distally where they merged into an extensor hood.  This was split for only about 5 mm distally to gain adequate exposure.  Periosteum was split in the midline and  reflected radially and ulnarly exposing the fracture.  The fracture was cleaned of debris and provisionally reduced with a clamp.  This was confirmed fluoroscopically.  A Y plate was selected as there was a zone of comminution distally where the radial wall was split out, creating a separate butterfly fragment.  The Y plate was measured for length, with 2 of the straight holes were removed with the plate cutter.  The plate was applied to the proximal shaft and secured with a nonlocking screw and one locking screw before placing the remainder the fixation.  Alignment was near-anatomic.  Final images were obtained revealing anatomical restoration of the metacarpal shape, with hardware which was not intra-articular and of appropriate length.  Final images were then obtained.  The wound was irrigated and the periosteum reapproximated with 4-0 Vicryl Rapide running suture.  The distal split in the extensor apparatus was reapproximated with 4-0 Vicryl.  Suture.  Tourniquet was released, additional hemostasis obtained with bipolar cautery and the skin was closed with 4-0 Vicryl Rapide running horizontal mattress suture.  A light dressing with a splint incorporated was placed, along with buddy taping the ring and small fingers, and he was taken to recovery room in stable condition.  DISPOSITION: He will be discharged home today with typical instructions, return in 10 to 15 days.  His removable splint should be removed and saved for reapplication, and he should have new x-rays of the right hand out of splint.

## 2020-03-03 NOTE — Anesthesia Procedure Notes (Signed)
Anesthesia Regional Block: Supraclavicular block   Pre-Anesthetic Checklist: ,, timeout performed, Correct Patient, Correct Site, Correct Laterality, Correct Procedure, Correct Position, site marked, Risks and benefits discussed,  Surgical consent,  Pre-op evaluation,  At surgeon's request and post-op pain management  Laterality: Right  Prep: chloraprep       Needles:  Injection technique: Single-shot  Needle Type: Echogenic Stimulator Needle     Needle Length: 9cm  Needle Gauge: 21   Needle insertion depth: 5 cm   Additional Needles:   Procedures:,,,, ultrasound used (permanent image in chart),,,,  Narrative:  Start time: 03/03/2020 7:00 AM End time: 03/03/2020 7:05 AM Injection made incrementally with aspirations every 5 mL.  Performed by: Personally  Anesthesiologist: Mal Amabile, MD  Additional Notes: Timeout performed. Patient sedated. Relevant anatomy ID'd using [place]. Incremental 2-82ml injection of LA with frequent aspiration. Patient tolerated procedure well.        Right Supraclavicular Block

## 2020-03-03 NOTE — Discharge Instructions (Signed)
Discharge Instructions   You have a dressing with a plaster splint incorporated in it. Move your fingers as much as possible, making a full fist and fully opening the fist. Elevate your hand to reduce pain & swelling of the digits.  Ice over the operative site may be helpful to reduce pain & swelling.  DO NOT USE HEAT. Pain medicine has been prescribed for you.  Take Ibuprofen 600 mg and Tylenol 650 mg every 6 hours together. Take the Oxycodone additionally for severe post operative pain. Leave the dressing in place until you return to our office.  You may shower, but keep the bandage clean & dry.  You may drive a car when you are off of prescription pain medications and can safely control your vehicle with both hands. Call our office and schedule a follow up appointment for 10-15 days from the date of surgery. Make sure that you bring the removable Velcro splint with you to the post operative appointment.   Please call [phone] during normal business hours or [phone] after hours for any problems. Including the following:  - excessive redness of the incisions - drainage for more than 4 days - fever of more than 101.5 F  *Please note that pain medications will not be refilled after hours or on weekends.    Post Anesthesia Home Care Instructions  Activity: Get plenty of rest for the remainder of the day. A responsible individual must stay with you for 24 hours following the procedure.  For the next 24 hours, DO NOT: -Drive a car -Paediatric nurse -Drink alcoholic beverages -Take any medication unless instructed by your physician -Make any legal decisions or sign important papers.  Meals: Start with liquid foods such as gelatin or soup. Progress to regular foods as tolerated. Avoid greasy, spicy, heavy foods. If nausea and/or vomiting occur, drink only clear liquids until the nausea and/or vomiting subsides. Call your physician if vomiting continues.  Special  Instructions/Symptoms: Your throat may feel dry or sore from the anesthesia or the breathing tube placed in your throat during surgery. If this causes discomfort, gargle with warm salt water. The discomfort should disappear within 24 hours.  If you had a scopolamine patch placed behind your ear for the management of post- operative nausea and/or vomiting:  1. The medication in the patch is effective for 72 hours, after which it should be removed.  Wrap patch in a tissue and discard in the trash. Wash hands thoroughly with soap and water. 2. You may remove the patch earlier than 72 hours if you experience unpleasant side effects which may include dry mouth, dizziness or visual disturbances. 3. Avoid touching the patch. Wash your hands with soap and water after contact with the patch.     Post Anesthesia Home Care Instructions  Activity: Get plenty of rest for the remainder of the day. A responsible individual must stay with you for 24 hours following the procedure.  For the next 24 hours, DO NOT: -Drive a car -Paediatric nurse -Drink alcoholic beverages -Take any medication unless instructed by your physician -Make any legal decisions or sign important papers.  Meals: Start with liquid foods such as gelatin or soup. Progress to regular foods as tolerated. Avoid greasy, spicy, heavy foods. If nausea and/or vomiting occur, drink only clear liquids until the nausea and/or vomiting subsides. Call your physician if vomiting continues.  Special Instructions/Symptoms: Your throat may feel dry or sore from the anesthesia or the breathing tube placed in your throat during  surgery. If this causes discomfort, gargle with warm salt water. The discomfort should disappear within 24 hours.  If you had a scopolamine patch placed behind your ear for the management of post- operative nausea and/or vomiting:  1. The medication in the patch is effective for 72 hours, after which it should be removed.  Wrap  patch in a tissue and discard in the trash. Wash hands thoroughly with soap and water. 2. You may remove the patch earlier than 72 hours if you experience unpleasant side effects which may include dry mouth, dizziness or visual disturbances. 3. Avoid touching the patch. Wash your hands with soap and water after contact with the patch.     Post Anesthesia Home Care Instructions  Activity: Get plenty of rest for the remainder of the day. A responsible individual must stay with you for 24 hours following the procedure.  For the next 24 hours, DO NOT: -Drive a car -Advertising copywriter -Drink alcoholic beverages -Take any medication unless instructed by your physician -Make any legal decisions or sign important papers.  Meals: Start with liquid foods such as gelatin or soup. Progress to regular foods as tolerated. Avoid greasy, spicy, heavy foods. If nausea and/or vomiting occur, drink only clear liquids until the nausea and/or vomiting subsides. Call your physician if vomiting continues.  Special Instructions/Symptoms: Your throat may feel dry or sore from the anesthesia or the breathing tube placed in your throat during surgery. If this causes discomfort, gargle with warm salt water. The discomfort should disappear within 24 hours.  If you had a scopolamine patch placed behind your ear for the management of post- operative nausea and/or vomiting:  1. The medication in the patch is effective for 72 hours, after which it should be removed.  Wrap patch in a tissue and discard in the trash. Wash hands thoroughly with soap and water. 2. You may remove the patch earlier than 72 hours if you experience unpleasant side effects which may include dry mouth, dizziness or visual disturbances. 3. Avoid touching the patch. Wash your hands with soap and water after contact with the patch.      Regional Anesthesia Blocks  1. Numbness or the inability to move the "blocked" extremity may last from 3-48  hours after placement. The length of time depends on the medication injected and your individual response to the medication. If the numbness is not going away after 48 hours, call your surgeon.  2. The extremity that is blocked will need to be protected until the numbness is gone and the  Strength has returned. Because you cannot feel it, you will need to take extra care to avoid injury. Because it may be weak, you may have difficulty moving it or using it. You may not know what position it is in without looking at it while the block is in effect.  3. For blocks in the legs and feet, returning to weight bearing and walking needs to be done carefully. You will need to wait until the numbness is entirely gone and the strength has returned. You should be able to move your leg and foot normally before you try and bear weight or walk. You will need someone to be with you when you first try to ensure you do not fall and possibly risk injury.  4. Bruising and tenderness at the needle site are common side effects and will resolve in a few days.  5. Persistent numbness or new problems with movement should be communicated to the  surgeon or the [facility] [phone] [facility] [phone]).

## 2020-03-03 NOTE — Interval H&P Note (Signed)
History and Physical Interval Note:  03/03/2020 7:27 AM  Cameron Franco  has presented today for surgery, with the diagnosis of RIGHT FIFTH METACARPAL FRACTURE.  The various methods of treatment have been discussed with the patient and family. After consideration of risks, benefits and other options for treatment, the patient has consented to  Procedure(s) with comments: PINNING VS OPEN TREATMENT OF RIGHT FIFTH METACARPAL FRACTURE (Right) - MAC WITH REGIONAL BLOCK as a surgical intervention.  The patient's history has been reviewed, patient examined, no change in status, stable for surgery.  I have reviewed the patient's chart and labs.  Questions were answered to the patient's satisfaction.     Jolyn Nap

## 2020-03-06 ENCOUNTER — Encounter: Payer: Self-pay | Admitting: *Deleted
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
## 2022-08-28 ENCOUNTER — Emergency Department (HOSPITAL_BASED_OUTPATIENT_CLINIC_OR_DEPARTMENT_OTHER): Payer: Commercial Managed Care - HMO

## 2022-08-28 ENCOUNTER — Encounter (HOSPITAL_BASED_OUTPATIENT_CLINIC_OR_DEPARTMENT_OTHER): Payer: Self-pay | Admitting: Pediatrics

## 2022-08-28 ENCOUNTER — Emergency Department (HOSPITAL_BASED_OUTPATIENT_CLINIC_OR_DEPARTMENT_OTHER)
Admission: EM | Admit: 2022-08-28 | Discharge: 2022-08-28 | Disposition: A | Discharge status: Home / Self Care | Payer: Commercial Managed Care - HMO | Attending: Emergency Medicine | Admitting: Emergency Medicine

## 2022-08-28 ENCOUNTER — Other Ambulatory Visit: Payer: Self-pay

## 2022-08-28 DIAGNOSIS — M79662 Pain in left lower leg: Secondary | ICD-10-CM | POA: Diagnosis present

## 2022-08-28 DIAGNOSIS — Y92410 Unspecified street and highway as the place of occurrence of the external cause: Secondary | ICD-10-CM | POA: Insufficient documentation

## 2022-08-28 DIAGNOSIS — M25562 Pain in left knee: Secondary | ICD-10-CM | POA: Insufficient documentation

## 2022-08-28 MED ORDER — IBUPROFEN 800 MG PO TABS
800.0000 mg | ORAL_TABLET | Freq: Once | ORAL | Status: AC
  Administered 2022-08-28: 800 mg via ORAL
  Filled 2022-08-28: qty 1
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
## 2022-08-28 NOTE — ED Notes (Signed)
D/c paperwork reviewed with pt. No questions or concerns at time of d/c. Ambulatory to ED exit.
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
